# Patient Record
Sex: Female | Born: 1980 | Race: White | Hispanic: No | Marital: Married
Health system: Southern US, Community
[De-identification: ages and names within clinical notes are randomized; demographics above are authoritative.]

---

## 2007-07-02 ENCOUNTER — Ambulatory Visit: Payer: Self-pay | Admitting: Internal Medicine

## 2009-02-23 IMAGING — RF DG BARIUM SWALLOW
1 series · 10 of 10 positions shown · non-contrast
Comparison: none

REASON FOR EXAM: H- hernia    Zenkers diverticulum
COMMENTS:

[Series 1: run · 10 of 10 slices shown]
[im 1/10]
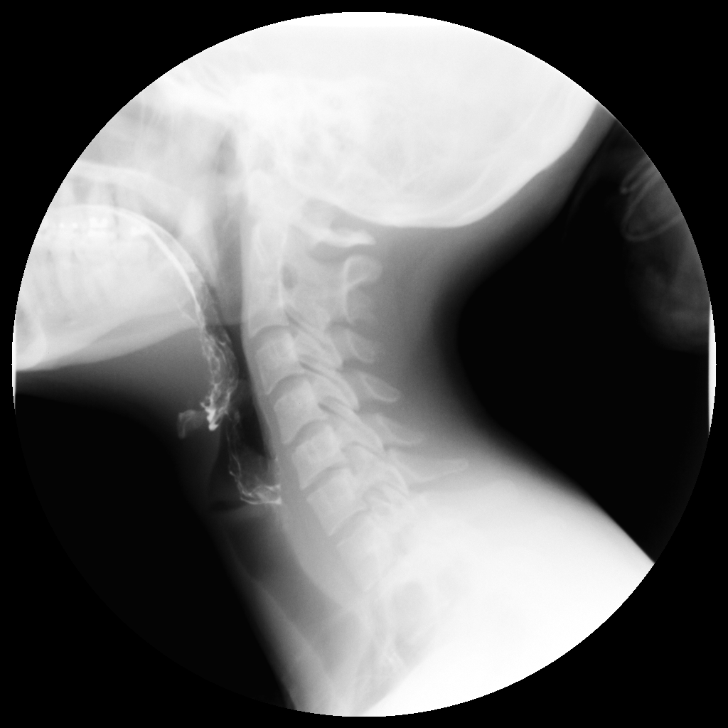
[im 2/10]
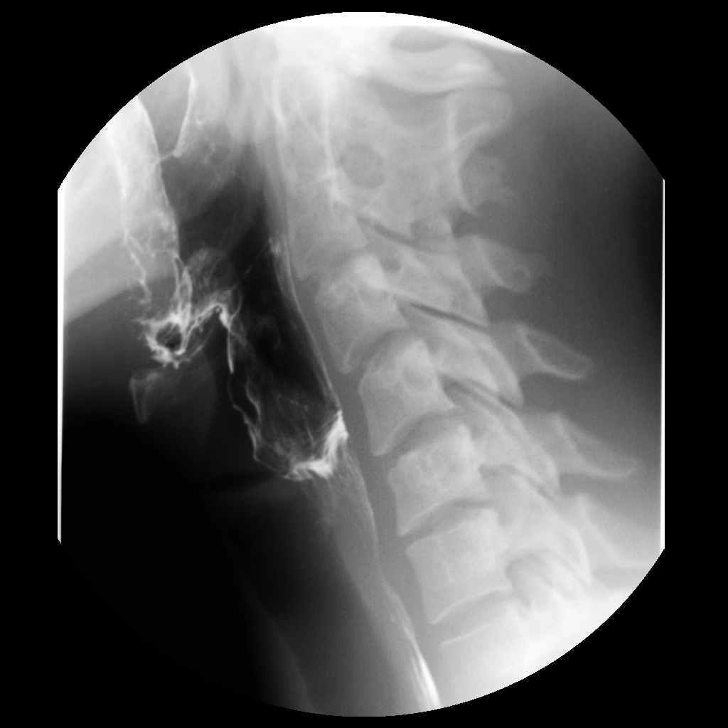
[im 3/10]
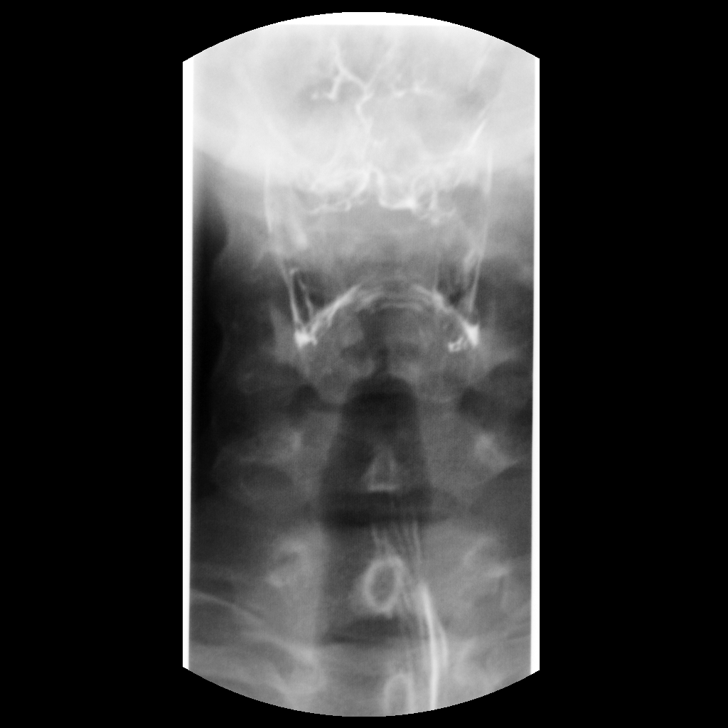
[im 4/10]
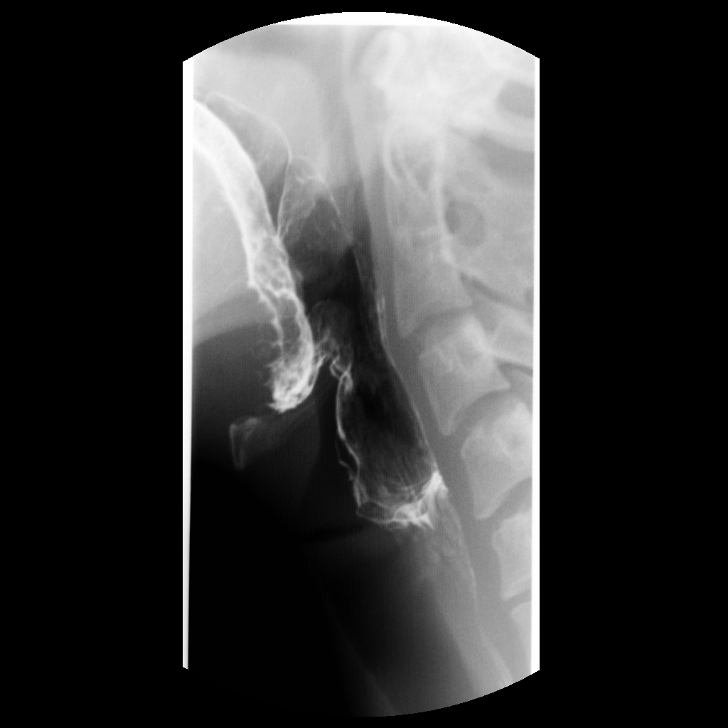
[im 5/10]
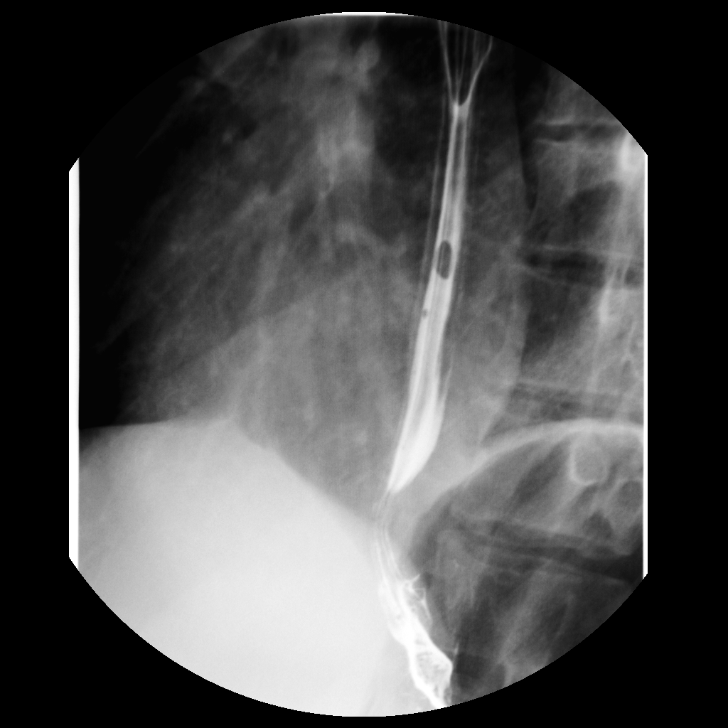
[im 6/10]
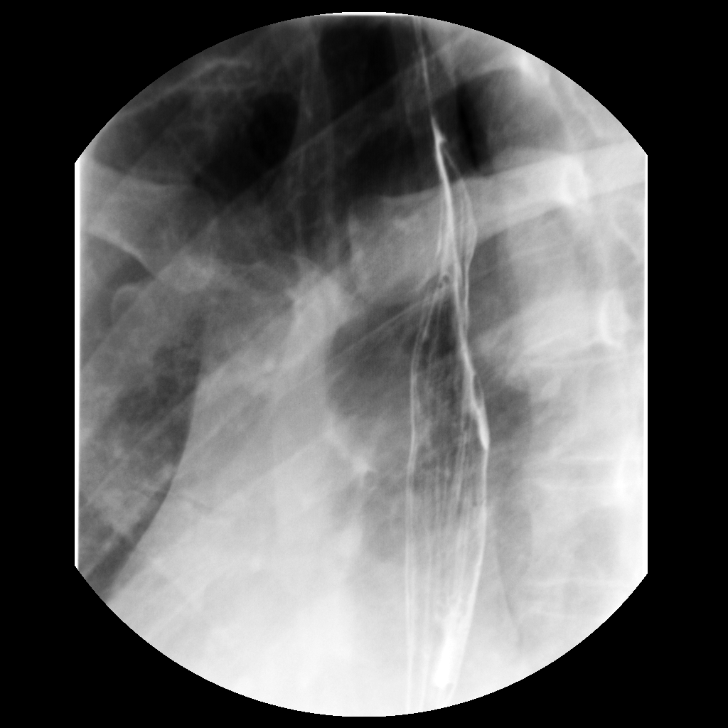
[im 7/10]
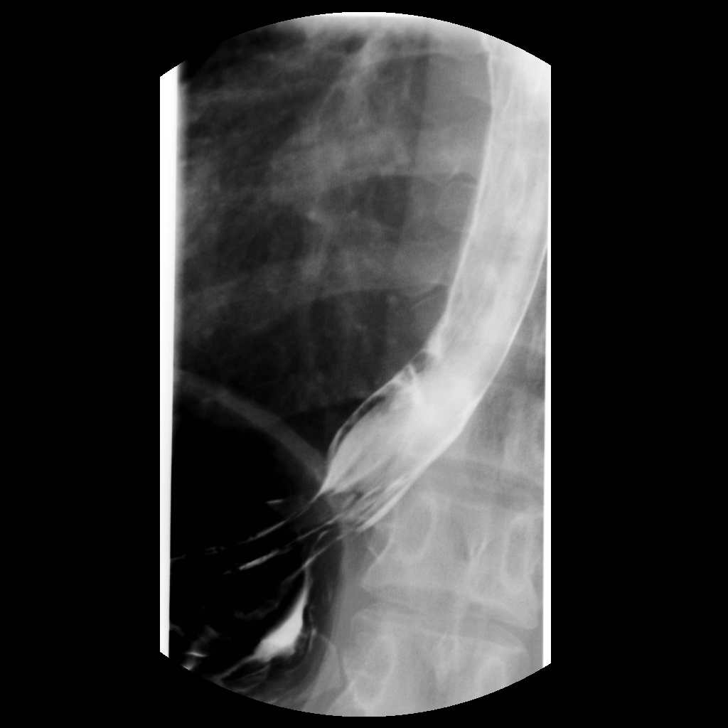
[im 8/10]
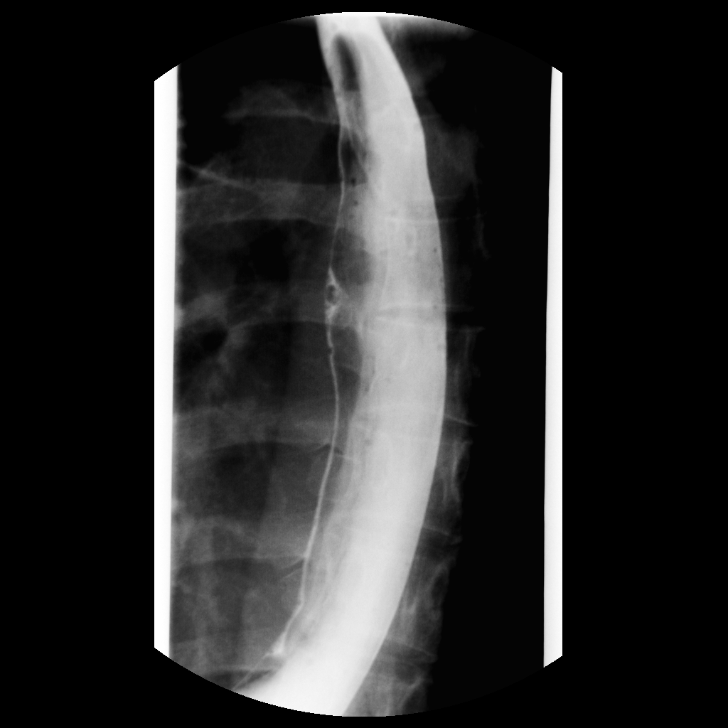
[im 9/10]
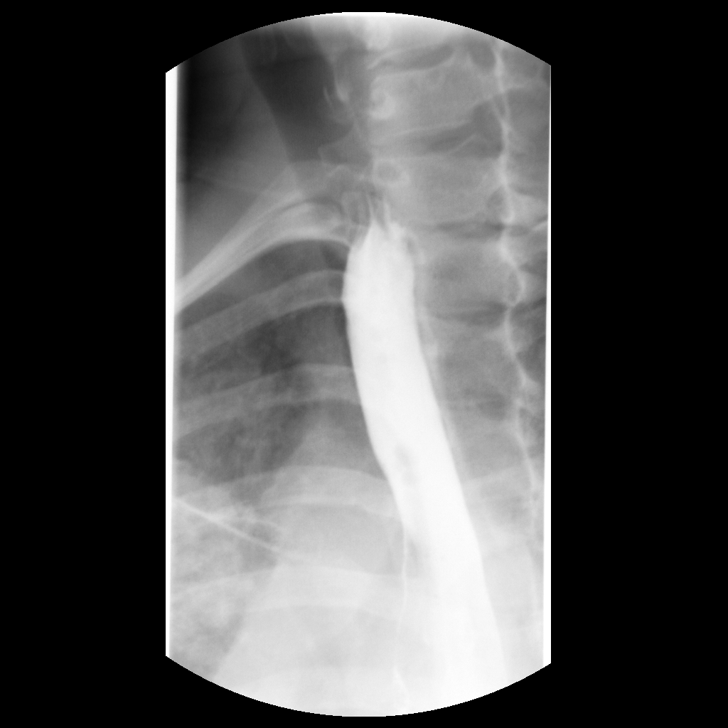
[im 10/10]
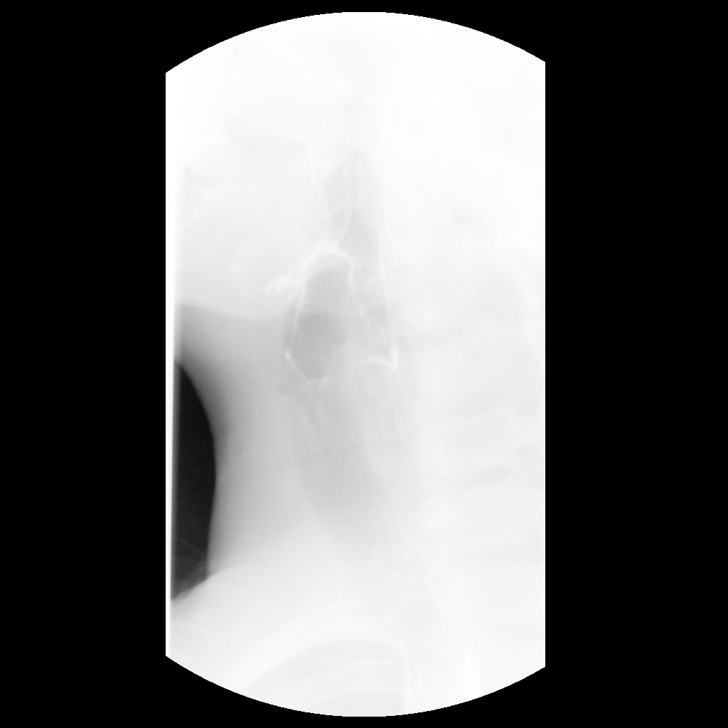

[10 of 10 positions shown; findings below may reference images not displayed]

PROCEDURE:     FL  - FL BARIUM SWALLOW  - July 02, 2007  [DATE]

RESULT:     The anticipated procedure was discussed with Ms. Choi. She
voiced her willingness to proceed.

The patient ingested barium without difficulty. There was no laryngeal
penetration of the barium. Transient pooling within the vallecula was seen.
The prevertebral soft tissue spaces are not abnormally thickened. No
diverticulum was seen in the cervical esophagus.

The thoracic esophagus distended well. There was no evidence of stricture
nor of esophagitis. No ulceration was demonstrated. In the prone position
there was no reflux elicited. The 12 mm barium pill passed without
difficulty.
IMPRESSION: Normal barium swallow. I do not see evidence of Bartz Kittu
diverticulum nor evidence of a hiatal hernia nor gastroesophageal reflux.

## 2017-08-15 ENCOUNTER — Telehealth: Payer: Self-pay

## 2017-08-15 NOTE — Telephone Encounter (Signed)
Erroneous Entry
